# Patient Record
Sex: Male | Born: 1975 | Race: Black or African American | Hispanic: No | Marital: Married | Smoking: Never smoker
Health system: Southern US, Community
[De-identification: ages and names within clinical notes are randomized; demographics above are authoritative.]

## PROBLEM LIST (undated history)

## (undated) DIAGNOSIS — R413 Other amnesia: Principal | ICD-10-CM

## (undated) DIAGNOSIS — R253 Fasciculation: Secondary | ICD-10-CM

## (undated) DIAGNOSIS — I10 Essential (primary) hypertension: Secondary | ICD-10-CM

## (undated) DIAGNOSIS — E7800 Pure hypercholesterolemia, unspecified: Secondary | ICD-10-CM

## (undated) HISTORY — PX: OTHER SURGICAL HISTORY: SHX169

## (undated) HISTORY — DX: Essential (primary) hypertension: I10

## (undated) HISTORY — DX: Other amnesia: R41.3

## (undated) HISTORY — DX: Fasciculation: R25.3

## (undated) HISTORY — DX: Pure hypercholesterolemia, unspecified: E78.00

---

## 1998-09-05 ENCOUNTER — Emergency Department (HOSPITAL_COMMUNITY): Admission: EM | Admit: 1998-09-05 | Discharge: 1998-09-05 | Payer: Self-pay | Admitting: Endocrinology

## 1998-09-05 ENCOUNTER — Encounter: Payer: Self-pay | Admitting: Endocrinology

## 1998-11-07 ENCOUNTER — Emergency Department (HOSPITAL_COMMUNITY): Admission: EM | Admit: 1998-11-07 | Discharge: 1998-11-07 | Payer: Self-pay | Admitting: Emergency Medicine

## 2004-05-16 ENCOUNTER — Emergency Department (HOSPITAL_COMMUNITY): Admission: EM | Admit: 2004-05-16 | Discharge: 2004-05-17 | Payer: Self-pay | Admitting: Emergency Medicine

## 2010-01-17 ENCOUNTER — Emergency Department (HOSPITAL_COMMUNITY): Admission: EM | Admit: 2010-01-17 | Discharge: 2010-01-17 | Payer: Self-pay | Admitting: Emergency Medicine

## 2010-05-21 LAB — POCT I-STAT, CHEM 8
BUN: 15 mg/dL (ref 6–23)
Calcium, Ion: 1.02 mmol/L — ABNORMAL LOW (ref 1.12–1.32)
Chloride: 110 mEq/L (ref 96–112)
Creatinine, Ser: 1.2 mg/dL (ref 0.4–1.5)
Glucose, Bld: 119 mg/dL — ABNORMAL HIGH (ref 70–99)
HCT: 48 % (ref 39.0–52.0)
Hemoglobin: 16.3 g/dL (ref 13.0–17.0)
Potassium: 3.5 mEq/L (ref 3.5–5.1)
Sodium: 143 mEq/L (ref 135–145)
TCO2: 22 mmol/L (ref 0–100)

## 2010-05-21 LAB — POCT CARDIAC MARKERS
CKMB, poc: 1.2 ng/mL (ref 1.0–8.0)
Myoglobin, poc: 116 ng/mL (ref 12–200)
Myoglobin, poc: 91.8 ng/mL (ref 12–200)
Troponin i, poc: <0.05 ng/mL (ref 0.00–0.09)

## 2010-05-21 LAB — RAPID URINE DRUG SCREEN, HOSP PERFORMED
Amphetamines: NOT DETECTED
Barbiturates: NOT DETECTED
Benzodiazepines: NOT DETECTED
Opiates: NOT DETECTED

## 2012-09-08 ENCOUNTER — Ambulatory Visit (INDEPENDENT_AMBULATORY_CARE_PROVIDER_SITE_OTHER): Payer: 59 | Admitting: Internal Medicine

## 2012-09-08 ENCOUNTER — Encounter: Payer: Self-pay | Admitting: Internal Medicine

## 2012-09-08 ENCOUNTER — Ambulatory Visit (INDEPENDENT_AMBULATORY_CARE_PROVIDER_SITE_OTHER)
Admission: RE | Admit: 2012-09-08 | Discharge: 2012-09-08 | Disposition: A | Discharge status: Home / Self Care | Payer: 59 | Source: Ambulatory Visit | Attending: Internal Medicine | Admitting: Internal Medicine

## 2012-09-08 ENCOUNTER — Other Ambulatory Visit (INDEPENDENT_AMBULATORY_CARE_PROVIDER_SITE_OTHER): Payer: 59

## 2012-09-08 VITALS — BP 122/86 | HR 68 | Temp 98.8°F | Resp 16 | Ht 67.0 in | Wt 220.0 lb

## 2012-09-08 DIAGNOSIS — Z0000 Encounter for general adult medical examination without abnormal findings: Secondary | ICD-10-CM

## 2012-09-08 DIAGNOSIS — R7309 Other abnormal glucose: Secondary | ICD-10-CM

## 2012-09-08 DIAGNOSIS — M25561 Pain in right knee: Secondary | ICD-10-CM

## 2012-09-08 DIAGNOSIS — E66811 Obesity, class 1: Secondary | ICD-10-CM | POA: Insufficient documentation

## 2012-09-08 DIAGNOSIS — E669 Obesity, unspecified: Secondary | ICD-10-CM

## 2012-09-08 DIAGNOSIS — B353 Tinea pedis: Secondary | ICD-10-CM

## 2012-09-08 DIAGNOSIS — M25569 Pain in unspecified knee: Secondary | ICD-10-CM

## 2012-09-08 LAB — LIPID PANEL
Cholesterol: 257 mg/dL — ABNORMAL HIGH (ref 0–200)
HDL: 31.5 mg/dL — ABNORMAL LOW (ref >39.00)
Total CHOL/HDL Ratio: 8
Triglycerides: 265 mg/dL — ABNORMAL HIGH (ref 0.0–149.0)

## 2012-09-08 LAB — COMPREHENSIVE METABOLIC PANEL
AST: 41 U/L — ABNORMAL HIGH (ref 0–37)
Albumin: 4.2 g/dL (ref 3.5–5.2)
BUN: 12 mg/dL (ref 6–23)
Calcium: 9.3 mg/dL (ref 8.4–10.5)
Chloride: 103 mEq/L (ref 96–112)
Creatinine, Ser: 1.1 mg/dL (ref 0.4–1.5)
Glucose, Bld: 95 mg/dL (ref 70–99)
Potassium: 3.6 mEq/L (ref 3.5–5.1)

## 2012-09-08 LAB — CBC WITH DIFFERENTIAL/PLATELET
Basophils Relative: 0.5 % (ref 0.0–3.0)
Eosinophils Relative: 2.8 % (ref 0.0–5.0)
Hemoglobin: 16.4 g/dL (ref 13.0–17.0)
Lymphocytes Relative: 37.3 % (ref 12.0–46.0)
Monocytes Relative: 8.7 % (ref 3.0–12.0)
Neutro Abs: 2.9 10*3/uL (ref 1.4–7.7)
Neutrophils Relative %: 50.7 % (ref 43.0–77.0)
RBC: 5.56 Mil/uL (ref 4.22–5.81)
WBC: 5.7 10*3/uL (ref 4.5–10.5)

## 2012-09-08 LAB — TSH: TSH: 2.13 u[IU]/mL (ref 0.35–5.50)

## 2012-09-08 MED ORDER — KETOCONAZOLE 2 % EX CREA: TOPICAL_CREAM | Freq: Two times a day (BID) | CUTANEOUS | Status: AC

## 2012-09-08 NOTE — Assessment & Plan Note (Signed)
I will check his A1C to see if he has developed DM2 

## 2012-09-08 NOTE — Assessment & Plan Note (Signed)
Will treat with ketoconazole cream 

## 2012-09-08 NOTE — Assessment & Plan Note (Signed)
Exam done Vaccines were reviewed Labs ordered Pt ed material was given 

## 2012-09-08 NOTE — Assessment & Plan Note (Signed)
I think he has DJD I will check the xray to see how severe this is and will look for bone spurring, joint space narrowing, etc He will start exercises to strengthen his quads/hamstrings/calves

## 2012-09-08 NOTE — Progress Notes (Signed)
Subjective:    Patient ID: Rodney Booth, male    DOB: 1975-06-25, 37 y.o.   MRN: [id number]  Rash This is a recurrent problem. The current episode started more than 1 month ago. The affected locations include the left foot and right foot. The rash is characterized by scaling, itchiness and redness. He was exposed to nothing. Associated symptoms include joint pain. Pertinent negatives include no anorexia, congestion, cough, diarrhea, eye pain, facial edema, fatigue (right knee pain for several months), fever, nail changes, rhinorrhea, shortness of breath, sore throat or vomiting. Past treatments include nothing. There is no history of allergies, asthma, eczema or varicella.      Review of Systems  Constitutional: Negative.  Negative for fever and fatigue (right knee pain for several months).  HENT: Negative.  Negative for congestion, sore throat and rhinorrhea.   Eyes: Negative.  Negative for pain.  Respiratory: Negative.  Negative for cough and shortness of breath.   Gastrointestinal: Negative.  Negative for vomiting, diarrhea and anorexia.  Endocrine: Negative.   Genitourinary: Negative.   Musculoskeletal: Positive for joint pain and arthralgias (rt knee pain when climbing stairs, some buckling as well). Negative for myalgias, back pain, joint swelling and gait problem.  Skin: Positive for rash. Negative for nail changes, color change, pallor and wound.  Allergic/Immunologic: Negative.   Neurological: Negative.   Hematological: Negative.  Negative for adenopathy. Does not bruise/bleed easily.  Psychiatric/Behavioral: Negative.        Objective:   Physical Exam  Vitals reviewed. Constitutional: He is oriented to person, place, and time. He appears well-developed and well-nourished. No distress.  HENT:  Head: Normocephalic and atraumatic.  Mouth/Throat: Oropharynx is clear and moist. No oropharyngeal exudate.  Eyes: Conjunctivae are normal. Right eye exhibits no discharge. Left eye  exhibits no discharge. No scleral icterus.  Neck: Normal range of motion. Neck supple. No JVD present. No tracheal deviation present. No thyromegaly present.  Cardiovascular: Normal rate, regular rhythm and intact distal pulses.  Exam reveals no gallop and no friction rub.   No murmur heard. Pulmonary/Chest: Effort normal and breath sounds normal. No stridor. No respiratory distress. He has no wheezes. He has no rales. He exhibits no tenderness.  Abdominal: Soft. Bowel sounds are normal. He exhibits no distension and no mass. There is no tenderness. There is no rebound and no guarding. Hernia confirmed negative in the right inguinal area and confirmed negative in the left inguinal area.  Genitourinary: Testes normal and penis normal. Right testis shows no mass, no swelling and no tenderness. Right testis is descended. Left testis shows no mass, no swelling and no tenderness. Left testis is descended. Circumcised. No penile erythema or penile tenderness. No discharge found.  Musculoskeletal: Normal range of motion. He exhibits no edema and no tenderness.       Right knee: He exhibits deformity (mild crepitance). He exhibits normal range of motion, no swelling, no effusion, no ecchymosis, no laceration, no erythema, normal alignment, no LCL laxity, normal patellar mobility and no bony tenderness. No tenderness found.  Lymphadenopathy:    He has no cervical adenopathy.       Right: No inguinal adenopathy present.       Left: No inguinal adenopathy present.  Neurological: He is oriented to person, place, and time.  Skin: Skin is warm, dry and intact. Rash noted. No abrasion, no bruising, no burn, no ecchymosis, no laceration, no lesion, no petechiae and no purpura noted. Rash is papular. Rash is not macular, not maculopapular,  not nodular, not pustular, not vesicular and not urticarial. He is not diaphoretic. No erythema. No pallor.  Both feet have scaly papules with mild purplish/erythema  Psychiatric:  He has a normal mood and affect. His behavior is normal. Judgment and thought content normal.     Lab Results  Component Value Date   HGB 16.3 QA FLAGS AND/OR RANGES MODIFIED BY DEMOGRAPHIC UPDATE ON 11/10 AT 0200 QA FLAGS AND/OR RANGES MODIFIED BY DEMOGRAPHIC UPDATE ON 11/10 AT 0401 QA FLAGS AND/OR RANGES MODIFIED BY DEMOGRAPHIC UPDATE ON 11/10 AT 1401 01/17/2010   HCT 48.0 QA FLAGS AND/OR RANGES MODIFIED BY DEMOGRAPHIC UPDATE ON 11/10 AT 0200 QA FLAGS AND/OR RANGES MODIFIED BY DEMOGRAPHIC UPDATE ON 11/10 AT 0401 QA FLAGS AND/OR RANGES MODIFIED BY DEMOGRAPHIC UPDATE ON 11/10 AT 1401 01/17/2010   GLUCOSE 119* 01/17/2010   NA 143 01/17/2010   K 3.5 01/17/2010   CL 110 01/17/2010   CREATININE 1.2 QA FLAGS AND/OR RANGES MODIFIED BY DEMOGRAPHIC UPDATE ON 11/10 AT 0200 QA FLAGS AND/OR RANGES MODIFIED BY DEMOGRAPHIC UPDATE ON 11/10 AT 0401 QA FLAGS AND/OR RANGES MODIFIED BY DEMOGRAPHIC UPDATE ON 11/10 AT 1401 01/17/2010   BUN 15 01/17/2010       Assessment & Plan:

## 2012-09-08 NOTE — Assessment & Plan Note (Signed)
He will work on his lifestyle modifications 

## 2012-09-08 NOTE — Patient Instructions (Signed)
Degenerative Arthritis You have osteoarthritis. This is the wear and tear arthritis that comes with aging. It is also called degenerative arthritis. This is common in people past middle age. It is caused by stress on the joints. The large weight bearing joints of the lower extremities are most often affected. The knees, hips, back, neck, and hands can become painful, swollen, and stiff. This is the most common type of arthritis. It comes on with age, carrying too much weight, or from an injury. Treatment includes resting the sore joint until the pain and swelling improve. Crutches or a walker may be needed for severe flares. Only take over-the-counter or prescription medicines for pain, discomfort, or fever as directed by your caregiver. Local heat therapy may improve motion. Cortisone shots into the joint are sometimes used to reduce pain and swelling during flares. Osteoarthritis is usually not crippling and progresses slowly. There are things you can do to decrease pain:  Avoid high impact activities.  Exercise regularly.  Low impact exercises such as walking, biking and swimming help to keep the muscles strong and keep normal joint function.  Stretching helps to keep your range of motion.  Lose weight if you are overweight. This reduces joint stress. In severe cases when you have pain at rest or increasing disability, joint surgery may be helpful. See your caregiver for follow-up treatment as recommended.  SEEK IMMEDIATE MEDICAL CARE IF:   You have severe joint pain.  Marked swelling and redness in your joint develops.  You develop a high fever. Document Released: 02/24/2005 Document Revised: 05/19/2011 Document Reviewed: 07/27/2006 [facility] Patient Information 2014 [place], [place]. Health Maintenance, Males A healthy lifestyle and preventative care can promote health and wellness.  Maintain regular health, dental, and eye exams.  Eat a healthy diet. Foods like vegetables, fruits,  whole grains, low-fat dairy products, and lean protein foods contain the nutrients you need without too many calories. Decrease your intake of foods high in solid fats, added sugars, and salt. Get information about a proper diet from your caregiver, if necessary.  Regular physical exercise is one of the most important things you can do for your health. Most adults should get at least 150 minutes of moderate-intensity exercise (any activity that increases your heart rate and causes you to sweat) each week. In addition, most adults need muscle-strengthening exercises on 2 or more days a week.   Maintain a healthy weight. The body mass index (BMI) is a screening tool to identify possible weight problems. It provides an estimate of body fat based on height and weight. Your caregiver can help determine your BMI, and can help you achieve or maintain a healthy weight. For adults 20 years and older:  A BMI below 18.5 is considered underweight.  A BMI of 18.5 to 24.9 is normal.  A BMI of 25 to 29.9 is considered overweight.  A BMI of 30 and above is considered obese.  Maintain normal blood lipids and cholesterol by exercising and minimizing your intake of saturated fat. Eat a balanced diet with plenty of fruits and vegetables. Blood tests for lipids and cholesterol should begin at age 67 and be repeated every 5 years. If your lipid or cholesterol levels are high, you are over 50, or you are a high risk for heart disease, you may need your cholesterol levels checked more frequently.Ongoing high lipid and cholesterol levels should be treated with medicines, if diet and exercise are not effective.  If you smoke, find out from your caregiver how  to quit. If you do not use tobacco, do not start.  If you choose to drink alcohol, do not exceed 2 drinks per day. One drink is considered to be 12 ounces (355 mL) of beer, 5 ounces (148 mL) of wine, or 1.5 ounces (44 mL) of liquor.  Avoid use of street drugs. Do  not share needles with anyone. Ask for help if you need support or instructions about stopping the use of drugs.  High blood pressure causes heart disease and increases the risk of stroke. Blood pressure should be checked at least every 1 to 2 years. Ongoing high blood pressure should be treated with medicines if weight loss and exercise are not effective.  If you are 31 to 35 years old, ask your caregiver if you should take aspirin to prevent heart disease.  Diabetes screening involves taking a blood sample to check your fasting blood sugar level. This should be done once every 3 years, after age 54, if you are within normal weight and without risk factors for diabetes. Testing should be considered at a younger age or be carried out more frequently if you are overweight and have at least 1 risk factor for diabetes.  Colorectal cancer can be detected and often prevented. Most routine colorectal cancer screening begins at the age of 7 and continues through age 70. However, your caregiver may recommend screening at an earlier age if you have risk factors for colon cancer. On a yearly basis, your caregiver may provide home test kits to check for hidden blood in the stool. Use of a small camera at the end of a tube, to directly examine the colon (sigmoidoscopy or colonoscopy), can detect the earliest forms of colorectal cancer. Talk to your caregiver about this at age 23, when routine screening begins. Direct examination of the colon should be repeated every 5 to 10 years through age 45, unless early forms of pre-cancerous polyps or small growths are found.  Hepatitis C blood testing is recommended for all people born from [birth year] through 1963 and any individual with known risks for hepatitis C.  Healthy men should no longer receive prostate-specific antigen (PSA) blood tests as part of routine cancer screening. Consult with your caregiver about prostate cancer screening.  Testicular cancer screening is not  recommended for adolescents or adult males who have no symptoms. Screening includes self-exam, caregiver exam, and other screening tests. Consult with your caregiver about any symptoms you have or any concerns you have about testicular cancer.  Practice safe sex. Use condoms and avoid high-risk sexual practices to reduce the spread of sexually transmitted infections (STIs).  Use sunscreen with a sun protection factor (SPF) of 30 or greater. Apply sunscreen liberally and repeatedly throughout the day. You should seek shade when your shadow is shorter than you. Protect yourself by wearing long sleeves, pants, a wide-brimmed hat, and sunglasses year round, whenever you are outdoors.  Notify your caregiver of new moles or changes in moles, especially if there is a change in shape or color. Also notify your caregiver if a mole is larger than the size of a pencil eraser.  A one-time screening for abdominal aortic aneurysm (AAA) and surgical repair of large AAAs by sound wave imaging (ultrasonography) is recommended for ages 56 to 17 years who are current or former smokers.  Stay current with your immunizations. Document Released: 08/23/2007 Document Revised: 05/19/2011 Document Reviewed: 07/22/2010 [facility] Patient Information 2014 [place], [place].

## 2012-09-09 ENCOUNTER — Encounter: Payer: Self-pay | Admitting: Internal Medicine

## 2013-01-13 ENCOUNTER — Other Ambulatory Visit: Payer: Self-pay

## 2013-08-31 ENCOUNTER — Ambulatory Visit (HOSPITAL_BASED_OUTPATIENT_CLINIC_OR_DEPARTMENT_OTHER): Payer: 59 | Attending: Internal Medicine

## 2013-08-31 VITALS — Ht 67.0 in | Wt 210.0 lb

## 2013-08-31 DIAGNOSIS — R0683 Snoring: Secondary | ICD-10-CM

## 2013-08-31 DIAGNOSIS — G4710 Hypersomnia, unspecified: Secondary | ICD-10-CM | POA: Insufficient documentation

## 2013-08-31 DIAGNOSIS — G4730 Sleep apnea, unspecified: Principal | ICD-10-CM

## 2013-09-10 DIAGNOSIS — R0989 Other specified symptoms and signs involving the circulatory and respiratory systems: Secondary | ICD-10-CM

## 2013-09-10 DIAGNOSIS — R0609 Other forms of dyspnea: Secondary | ICD-10-CM

## 2013-09-10 NOTE — Sleep Study (Signed)
   NAME: Rodney KingfisherMichael Schuyler DATE OF BIRTH:  08/07/1975 MEDICAL RECORD NUMBER [id number]  LOCATION: [facility]  PHYSICIAN: Yuvaan Olander D  DATE OF STUDY: 08/31/2013  SLEEP STUDY TYPE: Nocturnal Polysomnogram               REFERRING PHYSICIAN: Gwenyth Benderean, Eric L, MD  INDICATION FOR STUDY: Hypersomnia with sleep apnea  EPWORTH SLEEPINESS SCORE:   3/24 HEIGHT: 5\' 7"  (170.2 cm)  WEIGHT: 210 lb (95.255 kg)    Body mass index is 32.88 kg/(m^2).  NECK SIZE: 16.5 in.  MEDICATIONS: Charted for review  SLEEP ARCHITECTURE: Total sleep time 353 minutes with sleep efficiency 92.2%. Stage I was 6.2%, stage II 79.2%, stage III absent, REM 14.6% of total sleep time. Sleep latency 19.5 minutes, REM latency 89.5 minutes, awake after sleep onset 11 minutes, arousal index 13.8, bedtime medication: None  RESPIRATORY DATA: Apnea hypopneas index (AHI) 3.4 per hour. 20 total events scored including 4 obstructive apneas and 16 hypopneas. Events were not positional. REM AHI 10.5 per hour. The study was ordered as a diagnostic polysomnogram without CPAP.  OXYGEN DATA: Moderately loud snoring with oxygen desaturation to a nadir of 87% and a mean oxygen saturation through the study of 93.5% on room air.  CARDIAC DATA: Normal sinus rhythm  MOVEMENT/PARASOMNIA: No significant movement disturbance, no bathroom trips  IMPRESSION/ RECOMMENDATION:   1) Occasional respiratory event with sleep disturbance, within normal limits. AHI 3.4 per hour (the normal range for adults is an AHI from 0-5 events per hour). Moderately loud snoring with oxygen desaturation to a nadir of 87% and a mean saturation through the study of 93.5% on room air.   Waymon BudgeYOUNG,[name] Diplomate, American Board of Sleep Medicine  ELECTRONICALLY SIGNED ON:  09/10/2013, 12:03 PM [facility] PH: [phone]   FX: [phone] ACCREDITED BY THE AMERICAN ACADEMY OF SLEEP MEDICINE

## 2014-04-19 IMAGING — CR DG KNEE COMPLETE 4+V*R*
4 series · 4 of 4 positions shown · non-contrast
Comparison: None

CLINICAL DATA: Right knee pain for 3-4 months, no known injury

RIGHT KNEE - COMPLETE 4+ VIEW

[view not recorded (1 of 4)]
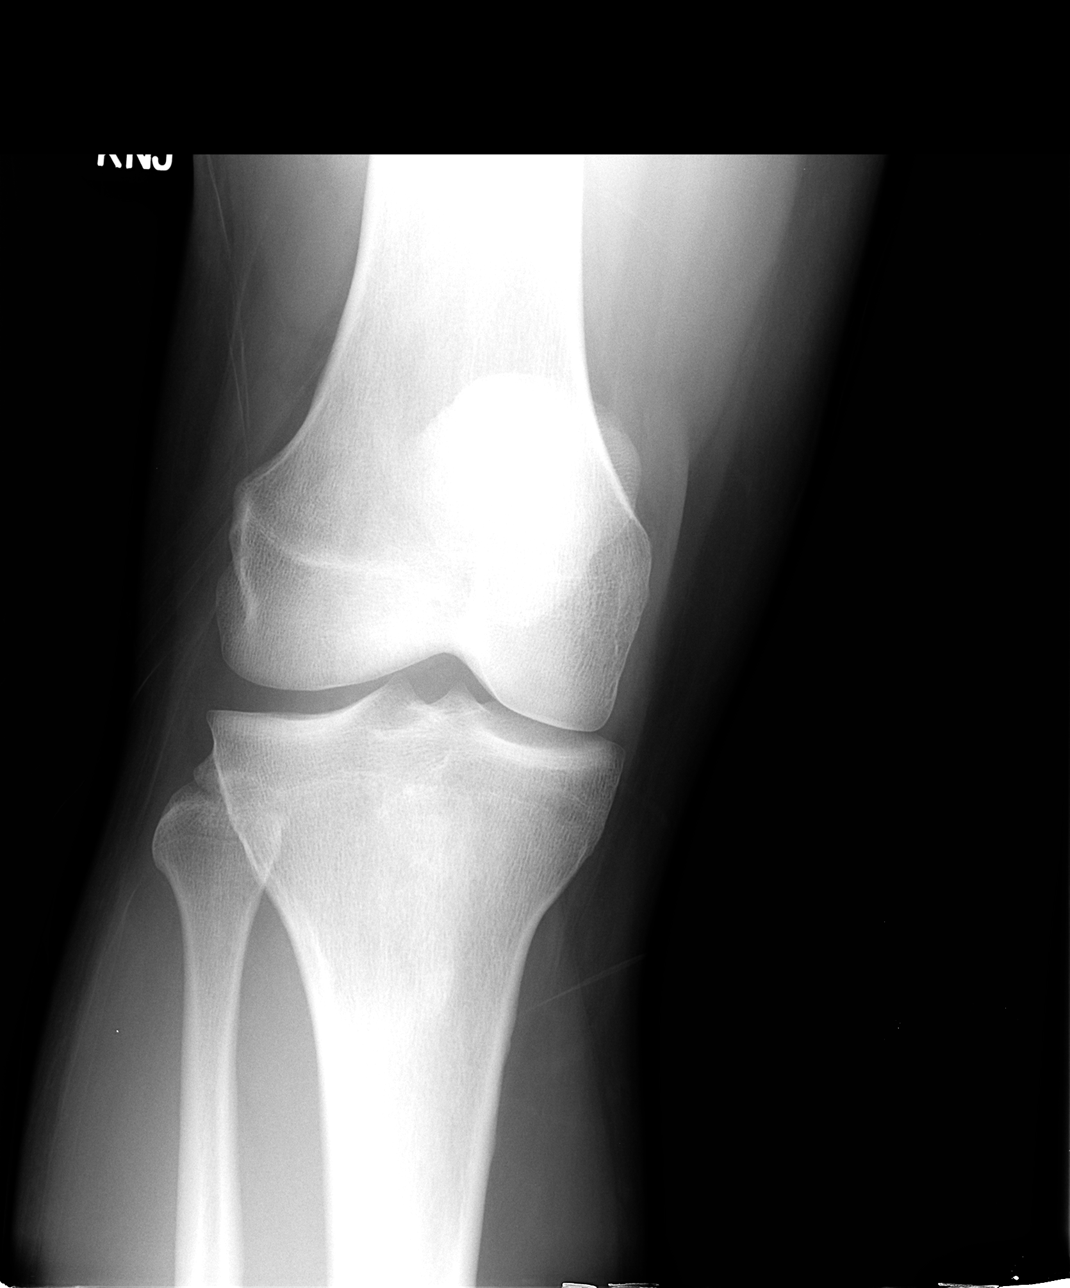

[view not recorded (2 of 4)]
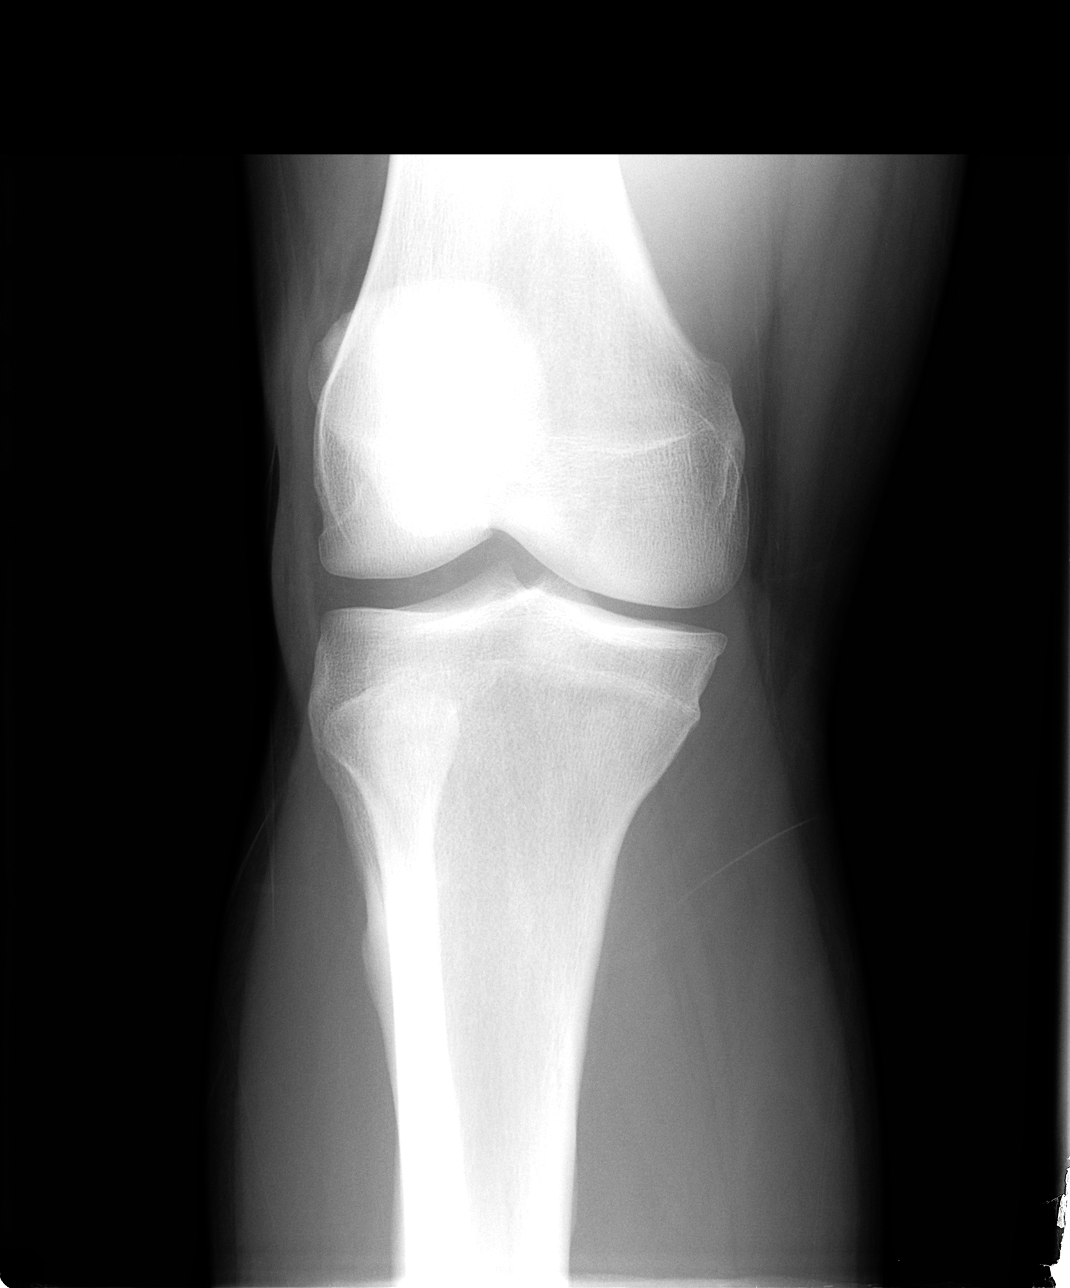

[view not recorded (3 of 4)]
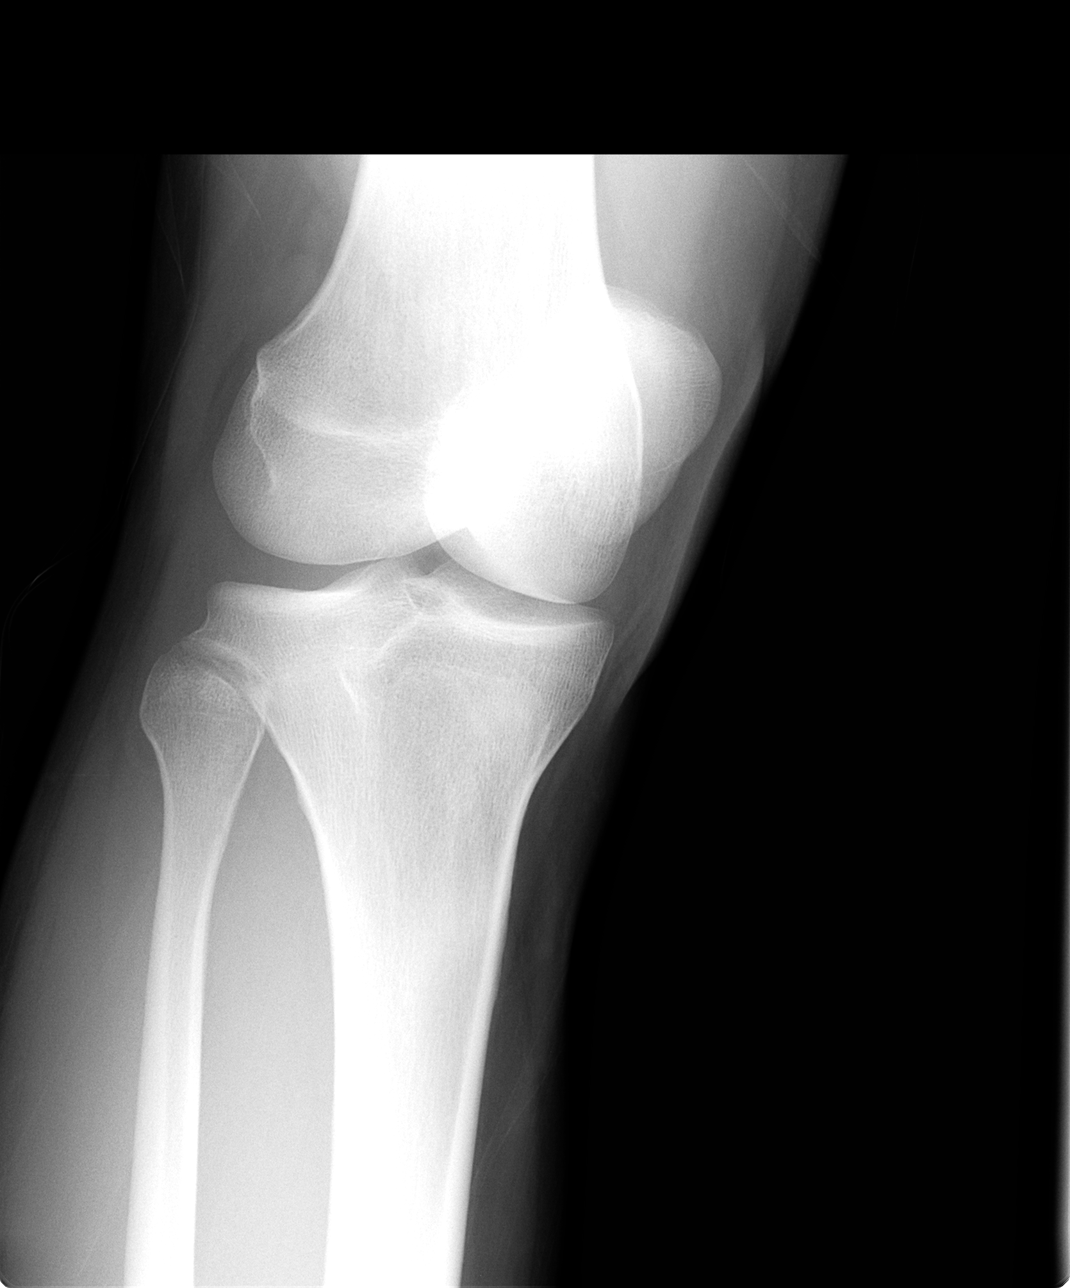

[view not recorded (4 of 4)]
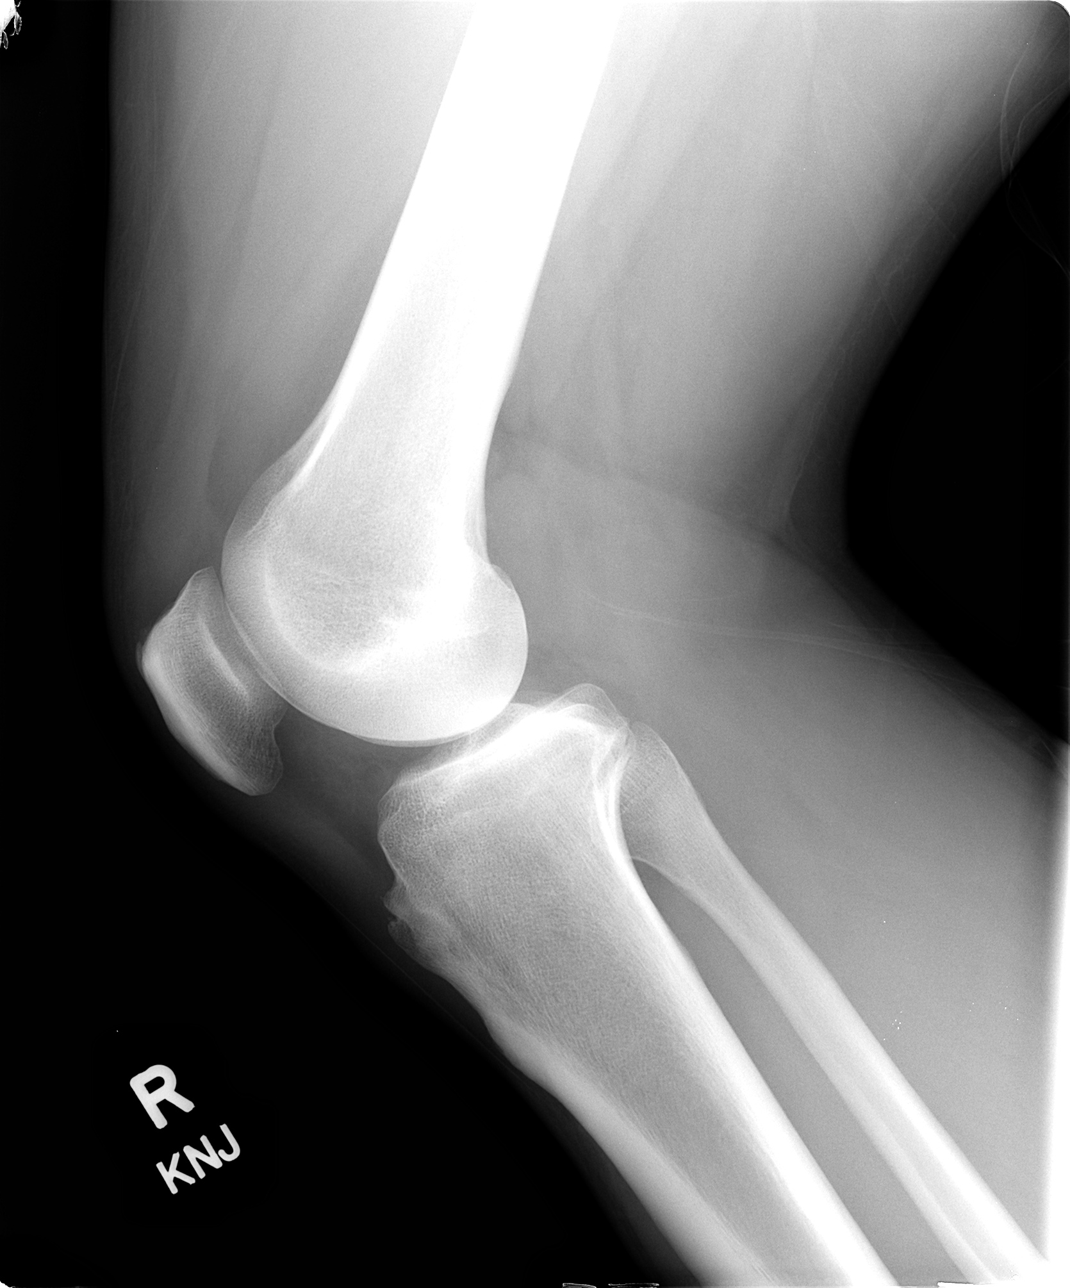

[4 of 4 positions shown; findings below may reference images not displayed]

FINDINGS: Minimal medial compartment joint space narrowing.
Osseous mineralization normal.
No acute fracture, dislocation or bone destruction.
No knee joint effusion.
IMPRESSION: Minimal degenerative changes at medial compartment right knee.

## 2015-06-19 ENCOUNTER — Encounter: Payer: Self-pay | Admitting: Neurology

## 2015-06-19 ENCOUNTER — Ambulatory Visit (INDEPENDENT_AMBULATORY_CARE_PROVIDER_SITE_OTHER): Payer: 59 | Admitting: Neurology

## 2015-06-19 VITALS — BP 136/84 | HR 76 | Ht 67.0 in | Wt 191.5 lb

## 2015-06-19 DIAGNOSIS — R413 Other amnesia: Secondary | ICD-10-CM

## 2015-06-19 HISTORY — DX: Other amnesia: R41.3

## 2015-06-19 NOTE — Progress Notes (Signed)
Reason for visit: Confusion  Referring physician: Dr. Jaquita Rector Rodney Booth is a 40 y.o. male  History of present illness:  Rodney Booth is a 40 year old right-handed black male with a history of onset of tremors and confusion that began suddenly around 06/11/2015. The patient indicated that he literally woke up with the sensation of feeling confused, and with difficulty with memory and concentration. He noticed tremors in the upper extremities bilaterally. He denied any true weakness or numbness of extremities with exception that he may have some tingling in his fingers that comes and goes at times. The patient denies that he has any balance issues or difficulty controlling the bowels or the bladder. The patient did not bite his tongue or have urinary incontinence that evening. Since the onset of the confusion, the patient has improved some. He does have some neck pain at times, he denies dizziness or headache. The patient is concerned he may have Huntington's disease as his paternal grandmother and paternal aunt had this disease. His father is 25 years old and he is unaffected. The patient is sent to this office for an evaluation. Recent blood work has shown evidence of a vitamin D deficiency with a level of 17. Hemoglobin A1c was 5.7. CBC was unremarkable with exception of a hemoglobin of 18 and a comprehensive metabolic profile was notable for a slight elevation in the ALT of 62. The patient reports that he has been under some stress recently as he is trying to sell his house, he feels somewhat anxious and nervous, he has not been sleeping well.  Past Medical History  Diagnosis Date  . Hypertension   . High cholesterol   . Memory difficulties 06/19/2015    Past Surgical History  Procedure Laterality Date  . Right shoulder surgery  About 10 yrs ago    Family History  Problem Relation Age of Onset  . Hypertension Father   . Diabetes Father   . Cancer Neg Hx   . COPD Neg Hx   .  Depression Neg Hx   . Alcohol abuse Neg Hx   . Birth defects Neg Hx   . Drug abuse Neg Hx   . Early death Neg Hx   . Hearing loss Neg Hx   . Heart disease Neg Hx   . Hyperlipidemia Neg Hx   . Kidney disease Neg Hx   . Stroke Neg Hx   . Huntington's disease Mother   . Huntington's disease Paternal Aunt   . Huntington's disease Paternal Grandmother     Social history:  reports that he has never smoked. He has never used smokeless tobacco. He reports that he drinks alcohol. He reports that he does not use illicit drugs.  Medications:  Prior to Admission medications   Medication Sig Start Date End Date Taking? Authorizing Provider  aspirin 81 MG EC tablet Take by mouth. Reported on 06/19/2015    Historical Provider, MD  atorvastatin (LIPITOR) 20 MG tablet Take by mouth. Reported on 06/19/2015    Historical Provider, MD  FISH OIL Take by mouth. Reported on 06/19/2015    Historical Provider, MD  ketoconazole (NIZORAL) 2 % cream Apply topically 2 (two) times daily. Patient not taking: Reported on 06/19/2015 09/08/12   Etta Grandchild, MD  metoprolol succinate (TOPROL-XL) 25 MG 24 hr tablet Take by mouth. Reported on 06/19/2015    Historical Provider, MD  triamterene-hydrochlorothiazide (MAXZIDE-25) 37.5-25 MG tablet Take by mouth. Reported on 06/19/2015    Historical Provider, MD  No Known Allergies  ROS:  Out of a complete 14 system review of symptoms, the patient complains only of the following symptoms, and all other reviewed systems are negative.  Chills, weight loss, fatigue Ringing in the ears Blurred vision Feeling cold Achy muscles Memory loss, confusion, weakness, dizziness, tremor Depression, anxiety, decreased appetite, disinterest in activities, racing thoughts Insomnia  Blood pressure 136/84, pulse 76, height 5\' 7"  (1.702 m), weight 191 lb 8 oz (86.864 kg).  Physical Exam  General: The patient is alert and cooperative at the time of the examination.  Eyes: Pupils are  equal, round, and reactive to light. Discs are flat bilaterally.  Neck: The neck is supple, no carotid bruits are noted.  Respiratory: The respiratory examination is clear.  Cardiovascular: The cardiovascular examination reveals a regular rate and rhythm, no obvious murmurs or rubs are noted.  Skin: Extremities are without significant edema.  Neurologic Exam  Mental status: The patient is alert and oriented x 3 at the time of the examination. The patient has apparent normal recent and remote memory, with an apparently normal attention span and concentration ability. Mini-Mental Status Examination done today shows a total score 30/30. The patient is able to name 9 animals in 30 seconds.  Cranial nerves: Facial symmetry is present. There is good sensation of the face to pinprick and soft touch bilaterally. The strength of the facial muscles and the muscles to head turning and shoulder shrug are normal bilaterally. Speech is well enunciated, no aphasia or dysarthria is noted. Extraocular movements are full. Visual fields are full. The tongue is midline, and the patient has symmetric elevation of the soft palate. No obvious hearing deficits are noted.  Motor: The motor testing reveals 5 over 5 strength of all 4 extremities. Good symmetric motor tone is noted throughout.  Sensory: Sensory testing is intact to pinprick, soft touch, vibration sensation, and position sense on all 4 extremities. No evidence of extinction is noted.  Coordination: Cerebellar testing reveals good finger-nose-finger and heel-to-shin bilaterally.  Gait and station: Gait is normal. Tandem gait is normal. Romberg is negative. No drift is seen.  Reflexes: Deep tendon reflexes are symmetric and normal bilaterally. Toes are downgoing bilaterally.   Assessment/Plan:  1. Reported confusion, tremors   The clinical examination today is unremarkable. The clinical history and presentation is not consistent with Huntington's  disease. The patient reports a sudden onset event, we will need to evaluate for the possibility of a nocturnal seizure, rule out cerebrovascular disease or demyelinating disease. MRI of the brain will be done, the patient will have blood work today, he will have an EEG study. If these studies are unremarkable, we will follow the patient conservatively. I suspect that anxiety is playing a large role in his current symptoms.    Rodney Booth. Keith Jalan Fariss MD 06/19/2015 8:16 PM  [facility] [address] [place] [id number]  Phone [phone] Fax [phone]

## 2015-06-20 ENCOUNTER — Ambulatory Visit (INDEPENDENT_AMBULATORY_CARE_PROVIDER_SITE_OTHER): Payer: 59

## 2015-06-20 DIAGNOSIS — R413 Other amnesia: Secondary | ICD-10-CM | POA: Diagnosis not present

## 2015-06-20 LAB — VITAMIN B12: Vitamin B-12: 483 pg/mL (ref 211–946)

## 2015-06-20 LAB — RPR: RPR Ser Ql: NONREACTIVE

## 2015-06-20 LAB — HIV ANTIBODY (ROUTINE TESTING W REFLEX): HIV Screen 4th Generation wRfx: NONREACTIVE

## 2015-06-20 LAB — SEDIMENTATION RATE: Sed Rate: 2 mm/hr (ref 0–15)

## 2015-06-20 MED ORDER — GADOPENTETATE DIMEGLUMINE 469.01 MG/ML IV SOLN: 18.0000 mL | Freq: Once | INTRAVENOUS | Status: AC | PRN

## 2015-06-22 ENCOUNTER — Telehealth: Payer: Self-pay | Admitting: Neurology

## 2015-06-22 ENCOUNTER — Encounter: Payer: Self-pay | Admitting: Neurology

## 2015-06-22 ENCOUNTER — Ambulatory Visit (INDEPENDENT_AMBULATORY_CARE_PROVIDER_SITE_OTHER): Payer: 59 | Admitting: Neurology

## 2015-06-22 DIAGNOSIS — R410 Disorientation, unspecified: Secondary | ICD-10-CM | POA: Diagnosis not present

## 2015-06-22 DIAGNOSIS — R413 Other amnesia: Secondary | ICD-10-CM

## 2015-06-22 NOTE — Telephone Encounter (Signed)
  I called the patient. The MRI of the brain is normal. Blood work was normal, EEG is pending.  MRI brain 06/21/15:  IMPRESSION:  Normal MRI brain (with and without).

## 2015-06-22 NOTE — Procedures (Signed)
    History:  Rodney Booth is a 40 year old gentleman with a history of sudden onset confusion upon awakening on 06/11/2015. The patient has also noted some tremors involving the upper extremities. He is being evaluated for this issue.  This is a routine EEG. No skull defects are noted. Medications include aspirin, Lipitor, fish oil, metoprolol, and Maxide.   EEG classification: Normal awake  Description of the recording: The background rhythms of this recording consists of a fairly well modulated medium amplitude alpha rhythm of 11 Hz that is reactive to eye opening and closure. As the record progresses, the patient appears to remain in the waking state throughout the recording. Photic stimulation was performed, resulting in a bilateral and symmetric photic driving response. Hyperventilation was also performed, resulting in a minimal buildup of the background rhythm activities without significant slowing seen. At no time during the recording does there appear to be evidence of spike or spike wave discharges or evidence of focal slowing. EKG monitor shows no evidence of cardiac rhythm abnormalities with a heart rate of 48.  Impression: This is a normal EEG recording in the waking state. No evidence of ictal or interictal discharges are seen.

## 2015-06-22 NOTE — Telephone Encounter (Signed)
I called the patient. EEG study that was done was normal. The blood work, MRI the brain, and the EEG study are unremarkable. We will follow the patient conservatively. If he seems to worsen with his symptoms, he is to contact our office.

## 2015-06-25 ENCOUNTER — Encounter: Payer: Self-pay | Admitting: Internal Medicine

## 2015-06-28 ENCOUNTER — Telehealth: Payer: Self-pay | Admitting: Neurology

## 2015-06-28 NOTE — Telephone Encounter (Signed)
Pt called request records for MRI and EEG. Pt also left VM for Debra.

## 2015-07-03 NOTE — Telephone Encounter (Signed)
Patient MRI and EGG report was mailed to his home address.

## 2015-07-04 ENCOUNTER — Other Ambulatory Visit: Payer: 59

## 2015-07-06 ENCOUNTER — Encounter: Payer: Self-pay | Admitting: Neurology

## 2015-07-06 ENCOUNTER — Other Ambulatory Visit: Payer: 59

## 2015-07-09 ENCOUNTER — Telehealth: Payer: Self-pay | Admitting: Neurology

## 2015-07-09 NOTE — Telephone Encounter (Signed)
Patient called to schedule follow up appointment with Dr. Anne HahnWillis for issue he has e-mailed Dr. Anne HahnWillis about. Appointment scheduled for 08/24/15 8:00am.

## 2015-07-09 NOTE — Telephone Encounter (Signed)
The patient indicates that he has muscular twitches, we will see him in June for this issue.

## 2015-07-10 NOTE — Telephone Encounter (Signed)
Returned pt Rodney Booth. He reports again that he is concerned as his grandmother had Huntington's disease. Says that he has been having "twitching all over and it's getting worse w/ stress/anxiety." Sooner appt scheduled for tomorrow morning @ 8 a.m. Pt voiced appreciation and agreement to arrive 15 min prior to scheduled appt time.

## 2015-07-10 NOTE — Telephone Encounter (Signed)
Patient called back, is requesting sooner appointment with Dr. Anne HahnWillis for twitching, states "twitching is getting out of control".

## 2015-07-11 ENCOUNTER — Encounter: Payer: Self-pay | Admitting: Neurology

## 2015-07-11 ENCOUNTER — Ambulatory Visit (INDEPENDENT_AMBULATORY_CARE_PROVIDER_SITE_OTHER): Payer: 59 | Admitting: Neurology

## 2015-07-11 VITALS — BP 128/84 | HR 80 | Ht 67.0 in | Wt 192.0 lb

## 2015-07-11 DIAGNOSIS — R413 Other amnesia: Secondary | ICD-10-CM

## 2015-07-11 DIAGNOSIS — R253 Fasciculation: Secondary | ICD-10-CM

## 2015-07-11 DIAGNOSIS — R258 Other abnormal involuntary movements: Secondary | ICD-10-CM | POA: Diagnosis not present

## 2015-07-11 HISTORY — DX: Fasciculation: R25.3

## 2015-07-11 NOTE — Progress Notes (Signed)
Reason for visit: Muscle twitches  Rodney Booth is an 40 y.o. male  History of present illness:  Rodney Booth is a 40 year old right-handed black male with a history of muscle twitches that he has noticed throughout the body that has worsened over the last 2 weeks. He indicates that 4 months ago he went off of coffee and caffeinated products because of some muscle twitches which seemed to help initially. He suffers from a significant underlying anxiety disorder as well, he has had some occasional panic attacks. The patient is quite concerned about a family history of Huntington's disease on the father's side, but his father has been quite healthy. The patient has noted twitches in the muscles of the arms and legs, occasionally with the eyelids. The patient denies any weakness, he does note some neck stiffness at times. He denies any overt cramps or pain in the muscle. He has not noted any muscle atrophy. He reports that he has some difficulty cutting his eyes from left to right. He has been placed on BuSpar, Seroquel, and Xanax for his anxiety problems, but he believes that this has worsened his anxiety issues.  Past Medical History  Diagnosis Date  . Hypertension   . High cholesterol   . Memory difficulties 06/19/2015  . Muscle twitching 07/11/2015    Past Surgical History  Procedure Laterality Date  . Right shoulder surgery  About 10 yrs ago    Family History  Problem Relation Age of Onset  . Hypertension Father   . Diabetes Father   . Cancer Neg Hx   . COPD Neg Hx   . Depression Neg Hx   . Alcohol abuse Neg Hx   . Birth defects Neg Hx   . Drug abuse Neg Hx   . Early death Neg Hx   . Hearing loss Neg Hx   . Heart disease Neg Hx   . Hyperlipidemia Neg Hx   . Kidney disease Neg Hx   . Stroke Neg Hx   . Huntington's disease Mother   . Huntington's disease Paternal Aunt   . Huntington's disease Paternal Grandmother     Social history:  reports that he has never smoked. He  has never used smokeless tobacco. He reports that he drinks alcohol. He reports that he does not use illicit drugs.   No Known Allergies  Medications:  Prior to Admission medications   Medication Sig Start Date End Date Taking? Authorizing Provider  aspirin 81 MG EC tablet Take by mouth. Reported on 06/19/2015    Historical Provider, MD  atorvastatin (LIPITOR) 20 MG tablet Take by mouth. Reported on 06/19/2015    Historical Provider, MD  FISH OIL Take by mouth. Reported on 06/19/2015    Historical Provider, MD  ketoconazole (NIZORAL) 2 % cream Apply topically 2 (two) times daily. Patient not taking: Reported on 06/19/2015 09/08/12   Etta Grandchild, MD  metoprolol succinate (TOPROL-XL) 25 MG 24 hr tablet Take by mouth. Reported on 06/19/2015    Historical Provider, MD  triamterene-hydrochlorothiazide (MAXZIDE-25) 37.5-25 MG tablet Take by mouth. Reported on 06/19/2015    Historical Provider, MD    ROS:  Out of a complete 14 system review of symptoms, the patient complains only of the following symptoms, and all other reviewed systems are negative.  Chills Ringing in the ears Blurred vision Palpitations of the heart Insomnia, frequent waking Achy muscles, walking difficulty, neck stiffness, neck pain Dizziness, tremors Confusion, decreased concentration, depression, anxiety  Blood pressure 128/84, pulse  80, height 5\' 7"  (1.702 m), weight 192 lb (87.091 kg).  Physical Exam  General: The patient is alert and cooperative at the time of the examination.  Neuromuscular: Thorough investigation of the arms, legs, chest, and abdomen showed no evidence of muscle twitches or fasciculations. No atrophy is seen.  Skin: No significant peripheral edema is noted.   Neurologic Exam  Mental status: The patient is alert and oriented x 3 at the time of the examination. The patient has apparent normal recent and remote memory, with an apparently normal attention span and concentration  ability.   Cranial nerves: Facial symmetry is present. Speech is normal, no aphasia or dysarthria is noted. Extraocular movements are full. Visual fields are full.  Motor: The patient has good strength in all 4 extremities.  Sensory examination: Soft touch sensation is symmetric on the face, arms, and legs. Pinprick and vibration sensation are symmetric and normal throughout.  Coordination: The patient has good finger-nose-finger and heel-to-shin bilaterally.  Gait and station: The patient has a normal gait. Tandem gait is normal. Romberg is negative. No drift is seen.  Reflexes: Deep tendon reflexes are symmetric.   Assessment/Plan:  1. Reports of muscle twitches  2. Anxiety disorder  The patient has no evidence of muscle twitches currently, but the patient indicates that the muscle twitches are constant and daily in nature. The patient has cut out caffeine which is appropriate. I suspect that the clinical syndrome is currently benign, and may be related to his heightened anxiety problems recently. The patient is seeking medical attention through his primary care physician for this issue. The patient will undergo some blood work today, we will follow the issue conservatively, if the problem worsens or persists or new issues such as atrophy or weakness are noted, EMG and nerve conduction study can be done in the future.  Rodney Booth. Keith Jamarious Febo MD 07/11/2015 8:44 PM  [facility] [address] [place], [place] [id number]  Phone [phone] Fax [phone]

## 2015-07-12 LAB — COMPREHENSIVE METABOLIC PANEL
A/G RATIO: 1.8 (ref 1.2–2.2)
ALBUMIN: 4.4 g/dL (ref 3.5–5.5)
ALT: 24 IU/L (ref 0–44)
AST: 18 IU/L (ref 0–40)
Alkaline Phosphatase: 78 IU/L (ref 39–117)
BILIRUBIN TOTAL: 0.6 mg/dL (ref 0.0–1.2)
BUN / CREAT RATIO: 12 (ref 9–20)
BUN: 14 mg/dL (ref 6–20)
CALCIUM: 9.5 mg/dL (ref 8.7–10.2)
CHLORIDE: 100 mmol/L (ref 96–106)
CO2: 27 mmol/L (ref 18–29)
Creatinine, Ser: 1.13 mg/dL (ref 0.76–1.27)
GFR, EST AFRICAN AMERICAN: 95 mL/min/{1.73_m2} (ref >59)
GFR, EST NON AFRICAN AMERICAN: 83 mL/min/{1.73_m2} (ref >59)
GLOBULIN, TOTAL: 2.4 g/dL (ref 1.5–4.5)
Glucose: 104 mg/dL — ABNORMAL HIGH (ref 65–99)
POTASSIUM: 3.9 mmol/L (ref 3.5–5.2)
SODIUM: 143 mmol/L (ref 134–144)
TOTAL PROTEIN: 6.8 g/dL (ref 6.0–8.5)

## 2015-07-12 LAB — TSH: TSH: 1.48 u[IU]/mL (ref 0.450–4.500)

## 2015-07-12 LAB — CK: Total CK: 123 U/L (ref 24–204)

## 2015-07-23 ENCOUNTER — Telehealth: Payer: Self-pay | Admitting: Neurology

## 2015-07-23 NOTE — Telephone Encounter (Signed)
Rodney Booth [phone]  [name]  [phone] * [phone]  LOOKING FOR RECORDS  PLEASE CALL BACK, THANK YOU  N   Operator called pt back, LM on VM that MRI and EEG was mailed on 07/03/15. He could call back if there were any further questions Not sure what records he is inquiring about- MRI and EEG mailed 07/03/15 to his home address

## 2015-08-01 NOTE — Telephone Encounter (Signed)
error 

## 2015-08-24 ENCOUNTER — Ambulatory Visit: Payer: 59 | Admitting: Neurology

## 2017-03-31 ENCOUNTER — Ambulatory Visit (INDEPENDENT_AMBULATORY_CARE_PROVIDER_SITE_OTHER): Payer: 59 | Admitting: Psychology

## 2017-03-31 DIAGNOSIS — F411 Generalized anxiety disorder: Secondary | ICD-10-CM | POA: Diagnosis not present

## 2017-04-14 ENCOUNTER — Ambulatory Visit: Payer: 59 | Admitting: Psychology

## 2017-04-28 ENCOUNTER — Ambulatory Visit: Payer: 59 | Admitting: Psychology

## 2017-05-21 ENCOUNTER — Other Ambulatory Visit (HOSPITAL_BASED_OUTPATIENT_CLINIC_OR_DEPARTMENT_OTHER): Payer: Self-pay

## 2017-05-21 DIAGNOSIS — R0683 Snoring: Secondary | ICD-10-CM

## 2017-05-21 DIAGNOSIS — G4730 Sleep apnea, unspecified: Secondary | ICD-10-CM

## 2017-06-04 ENCOUNTER — Encounter (HOSPITAL_BASED_OUTPATIENT_CLINIC_OR_DEPARTMENT_OTHER): Payer: Self-pay

## 2019-01-24 ENCOUNTER — Other Ambulatory Visit: Payer: Self-pay

## 2019-01-24 DIAGNOSIS — Z20822 Contact with and (suspected) exposure to covid-19: Secondary | ICD-10-CM

## 2019-01-26 LAB — NOVEL CORONAVIRUS, NAA: SARS-CoV-2, NAA: NOT DETECTED

## 2019-05-15 ENCOUNTER — Ambulatory Visit: Payer: Self-pay | Attending: Internal Medicine

## 2019-05-15 DIAGNOSIS — Z23 Encounter for immunization: Secondary | ICD-10-CM | POA: Insufficient documentation

## 2019-05-15 NOTE — Progress Notes (Signed)
   Covid-19 Vaccination Clinic  Name:  Rodney Booth    MRN: [id number] DOB: [birth year]  05/15/2019  Rodney Booth was observed post Covid-19 immunization for 15 minutes without incident. He was provided with Vaccine Information Sheet and instruction to access the V-Safe system.   Rodney Booth was instructed to call 911 with any severe reactions post vaccine: Marland Kitchen Difficulty breathing  . Swelling of face and throat  . A fast heartbeat  . A bad rash all over body  . Dizziness and weakness   Immunizations Administered    Name Date Dose VIS Date Route   Pfizer COVID-19 Vaccine 05/15/2019  9:21 AM 0.3 mL 02/18/2019 Intramuscular   Manufacturer: [employer], [employer]   Lot: [id number]   NDC: [address]-6

## 2019-06-14 ENCOUNTER — Ambulatory Visit: Payer: Self-pay | Attending: Internal Medicine

## 2019-06-14 DIAGNOSIS — Z23 Encounter for immunization: Secondary | ICD-10-CM

## 2019-06-14 NOTE — Progress Notes (Signed)
   Covid-19 Vaccination Clinic  Name:  Rodney Booth    MRN: [id number] DOB: October 24, 1975  06/14/2019  Rodney Booth was observed post Covid-19 immunization for 15 minutes without incident. He was provided with Vaccine Information Sheet and instruction to access the V-Safe system.   Rodney Booth was instructed to call 911 with any severe reactions post vaccine: Marland Kitchen Difficulty breathing  . Swelling of face and throat  . A fast heartbeat  . A bad rash all over body  . Dizziness and weakness   Immunizations Administered    Name Date Dose VIS Date Route   Pfizer COVID-19 Vaccine 06/14/2019  1:22 PM 0.3 mL 02/18/2019 Intramuscular   Manufacturer: [employer], [employer]   Lot: [id number]   NDC: [address]-3

## 2019-11-04 ENCOUNTER — Other Ambulatory Visit: Payer: 59

## 2019-11-04 ENCOUNTER — Other Ambulatory Visit: Payer: Self-pay

## 2019-11-04 DIAGNOSIS — Z20822 Contact with and (suspected) exposure to covid-19: Secondary | ICD-10-CM

## 2019-11-06 LAB — SARS-COV-2, NAA 2 DAY TAT

## 2019-11-06 LAB — NOVEL CORONAVIRUS, NAA: SARS-CoV-2, NAA: NOT DETECTED

## 2020-01-05 ENCOUNTER — Ambulatory Visit: Payer: 59 | Attending: Internal Medicine

## 2020-01-05 DIAGNOSIS — Z23 Encounter for immunization: Secondary | ICD-10-CM

## 2020-01-05 NOTE — Progress Notes (Signed)
   Covid-19 Vaccination Clinic  Name:  [name]    MRN: [id number] DOB: June 14, 1975  01/05/2020  Mr. Fritze was observed post Covid-19 immunization for 15 minutes without incident. He was provided with Vaccine Information Sheet and instruction to access the V-Safe system.   Mr. Mcleary was instructed to call 911 with any severe reactions post vaccine: Marland Kitchen Difficulty breathing  . Swelling of face and throat  . A fast heartbeat  . A bad rash all over body  . Dizziness and weakness

## 2023-01-26 ENCOUNTER — Other Ambulatory Visit (HOSPITAL_COMMUNITY): Payer: Self-pay

## 2023-01-26 MED ORDER — SEMAGLUTIDE-WEIGHT MANAGEMENT 0.25 MG/0.5ML ~~LOC~~ SOAJ
0.2500 mg | SUBCUTANEOUS | 2 refills | Status: DC
  Filled 2023-01-26: qty 2, 28d supply, fill #0
  Filled 2023-02-13 – 2023-02-16 (×2): qty 2, 28d supply, fill #1
  Filled 2023-03-16 – 2023-03-31 (×5): qty 2, 28d supply, fill #2

## 2023-01-27 ENCOUNTER — Other Ambulatory Visit (HOSPITAL_COMMUNITY): Payer: Self-pay

## 2023-02-13 ENCOUNTER — Other Ambulatory Visit (HOSPITAL_COMMUNITY): Payer: Self-pay

## 2023-02-18 ENCOUNTER — Ambulatory Visit (HOSPITAL_COMMUNITY): Payer: Self-pay

## 2023-03-05 ENCOUNTER — Other Ambulatory Visit (HOSPITAL_COMMUNITY): Payer: Self-pay

## 2023-03-16 ENCOUNTER — Other Ambulatory Visit (HOSPITAL_COMMUNITY): Payer: Self-pay

## 2023-03-18 ENCOUNTER — Other Ambulatory Visit (HOSPITAL_COMMUNITY): Payer: Self-pay

## 2023-03-27 ENCOUNTER — Other Ambulatory Visit (HOSPITAL_COMMUNITY): Payer: Self-pay

## 2023-03-30 ENCOUNTER — Other Ambulatory Visit (HOSPITAL_COMMUNITY): Payer: Self-pay

## 2023-03-31 ENCOUNTER — Other Ambulatory Visit (HOSPITAL_COMMUNITY): Payer: Self-pay

## 2023-04-01 ENCOUNTER — Other Ambulatory Visit (HOSPITAL_COMMUNITY): Payer: Self-pay

## 2023-04-02 ENCOUNTER — Encounter (HOSPITAL_COMMUNITY): Payer: Self-pay

## 2023-04-02 ENCOUNTER — Other Ambulatory Visit (HOSPITAL_COMMUNITY): Payer: Self-pay

## 2023-04-02 MED ORDER — WEGOVY 0.5 MG/0.5ML ~~LOC~~ SOAJ
0.5000 mg | SUBCUTANEOUS | 3 refills | Status: AC
  Filled 2023-04-02: qty 2, 28d supply, fill #0
  Filled 2023-04-26: qty 2, 28d supply, fill #1
  Filled 2023-05-24: qty 2, 28d supply, fill #2

## 2023-05-25 ENCOUNTER — Other Ambulatory Visit (HOSPITAL_COMMUNITY): Payer: Self-pay

## 2023-06-01 ENCOUNTER — Encounter (HOSPITAL_COMMUNITY): Payer: Self-pay

## 2023-06-01 ENCOUNTER — Other Ambulatory Visit (HOSPITAL_COMMUNITY): Payer: Self-pay

## 2023-06-01 MED ORDER — WEGOVY 1 MG/0.5ML ~~LOC~~ SOAJ
1.0000 mg | SUBCUTANEOUS | 1 refills | Status: AC
  Filled 2023-06-01: qty 2, 28d supply, fill #0
  Filled 2023-06-21 – 2023-06-23 (×2): qty 2, 28d supply, fill #1

## 2023-06-04 ENCOUNTER — Other Ambulatory Visit: Payer: Self-pay

## 2023-06-18 ENCOUNTER — Other Ambulatory Visit: Payer: Self-pay

## 2023-06-19 ENCOUNTER — Other Ambulatory Visit: Payer: Self-pay

## 2023-06-22 ENCOUNTER — Other Ambulatory Visit (HOSPITAL_BASED_OUTPATIENT_CLINIC_OR_DEPARTMENT_OTHER): Payer: Self-pay

## 2023-07-14 ENCOUNTER — Other Ambulatory Visit: Payer: Self-pay

## 2023-07-21 ENCOUNTER — Other Ambulatory Visit: Payer: Self-pay

## 2023-08-10 ENCOUNTER — Other Ambulatory Visit (HOSPITAL_BASED_OUTPATIENT_CLINIC_OR_DEPARTMENT_OTHER): Payer: Self-pay

## 2023-08-12 ENCOUNTER — Other Ambulatory Visit (HOSPITAL_COMMUNITY): Payer: Self-pay

## 2023-08-12 MED ORDER — WEGOVY 2.4 MG/0.75ML ~~LOC~~ SOAJ
2.4000 mg | SUBCUTANEOUS | 3 refills | Status: DC
  Filled 2023-08-12 – 2023-08-13 (×2): qty 3, 28d supply, fill #0
  Filled 2023-09-03: qty 3, 28d supply, fill #1
  Filled 2023-10-03: qty 3, 28d supply, fill #2
  Filled 2023-10-31: qty 3, 28d supply, fill #3

## 2023-08-13 ENCOUNTER — Other Ambulatory Visit (HOSPITAL_COMMUNITY): Payer: Self-pay

## 2023-08-13 ENCOUNTER — Other Ambulatory Visit (HOSPITAL_BASED_OUTPATIENT_CLINIC_OR_DEPARTMENT_OTHER): Payer: Self-pay

## 2023-08-13 ENCOUNTER — Encounter (HOSPITAL_COMMUNITY): Payer: Self-pay

## 2023-10-12 ENCOUNTER — Other Ambulatory Visit (HOSPITAL_BASED_OUTPATIENT_CLINIC_OR_DEPARTMENT_OTHER): Payer: Self-pay

## 2023-10-12 MED ORDER — AZITHROMYCIN 250 MG PO TABS
ORAL_TABLET | ORAL | 0 refills | Status: AC
  Filled 2023-10-12: qty 6, 5d supply, fill #0

## 2023-12-06 ENCOUNTER — Other Ambulatory Visit (HOSPITAL_BASED_OUTPATIENT_CLINIC_OR_DEPARTMENT_OTHER): Payer: Self-pay

## 2023-12-06 MED ORDER — SEMAGLUTIDE-WEIGHT MANAGEMENT 2.4 MG/0.75ML ~~LOC~~ SOAJ
2.4000 mg | SUBCUTANEOUS | 3 refills | Status: DC
  Filled 2023-12-06: qty 3, 28d supply, fill #0
  Filled 2023-12-28: qty 3, 28d supply, fill #1
  Filled 2024-01-24: qty 3, 28d supply, fill #2
  Filled 2024-02-25: qty 3, 28d supply, fill #3

## 2023-12-07 ENCOUNTER — Other Ambulatory Visit (HOSPITAL_BASED_OUTPATIENT_CLINIC_OR_DEPARTMENT_OTHER): Payer: Self-pay

## 2023-12-30 ENCOUNTER — Other Ambulatory Visit (HOSPITAL_BASED_OUTPATIENT_CLINIC_OR_DEPARTMENT_OTHER): Payer: Self-pay

## 2024-02-22 ENCOUNTER — Other Ambulatory Visit (HOSPITAL_BASED_OUTPATIENT_CLINIC_OR_DEPARTMENT_OTHER): Payer: Self-pay

## 2024-02-22 MED ORDER — NA SULFATE-K SULFATE-MG SULF 17.5-3.13-1.6 GM/177ML PO SOLN
ORAL | 0 refills | Status: AC
  Filled 2024-02-22: qty 354, 1d supply, fill #0

## 2024-04-01 ENCOUNTER — Other Ambulatory Visit (HOSPITAL_BASED_OUTPATIENT_CLINIC_OR_DEPARTMENT_OTHER): Payer: Self-pay

## 2024-04-02 ENCOUNTER — Other Ambulatory Visit (HOSPITAL_BASED_OUTPATIENT_CLINIC_OR_DEPARTMENT_OTHER): Payer: Self-pay

## 2024-04-02 MED ORDER — SEMAGLUTIDE-WEIGHT MANAGEMENT 2.4 MG/0.75ML ~~LOC~~ SOAJ
2.4000 mg | SUBCUTANEOUS | 3 refills | Status: AC
  Filled 2024-04-02: qty 3, 28d supply, fill #0

## 2024-04-14 ENCOUNTER — Other Ambulatory Visit (HOSPITAL_BASED_OUTPATIENT_CLINIC_OR_DEPARTMENT_OTHER): Payer: Self-pay
